# Patient Record
Sex: Male | Born: 2005 | Race: Black or African American | Hispanic: No | Marital: Single
Health system: Southern US, Community
[De-identification: ages and names within clinical notes are randomized; demographics above are authoritative.]

## PROBLEM LIST (undated history)

## (undated) DIAGNOSIS — J302 Other seasonal allergic rhinitis: Secondary | ICD-10-CM

## (undated) DIAGNOSIS — K219 Gastro-esophageal reflux disease without esophagitis: Secondary | ICD-10-CM

---
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
## 2024-02-05 DIAGNOSIS — Z419 Encounter for procedure for purposes other than remedying health state, unspecified: Secondary | ICD-10-CM | POA: Diagnosis not present
